# Patient Record
Sex: Female | Born: 1975 | Hispanic: No | Marital: Married | Smoking: Former smoker
Health system: Southern US, Community
[De-identification: ages and names within clinical notes are randomized; demographics above are authoritative.]

## PROBLEM LIST (undated history)

## (undated) DIAGNOSIS — R87619 Unspecified abnormal cytological findings in specimens from cervix uteri: Secondary | ICD-10-CM

## (undated) HISTORY — DX: Unspecified abnormal cytological findings in specimens from cervix uteri: R87.619

---

## 2003-10-28 ENCOUNTER — Other Ambulatory Visit: Admission: RE | Admit: 2003-10-28 | Discharge: 2003-10-28 | Payer: Self-pay | Admitting: Obstetrics and Gynecology

## 2004-11-15 ENCOUNTER — Other Ambulatory Visit: Admission: RE | Admit: 2004-11-15 | Discharge: 2004-11-15 | Payer: Self-pay | Admitting: Obstetrics and Gynecology

## 2005-11-17 ENCOUNTER — Other Ambulatory Visit: Admission: RE | Admit: 2005-11-17 | Discharge: 2005-11-17 | Payer: Self-pay | Admitting: Obstetrics and Gynecology

## 2006-11-21 ENCOUNTER — Other Ambulatory Visit: Admission: RE | Admit: 2006-11-21 | Discharge: 2006-11-21 | Payer: Self-pay | Admitting: Obstetrics and Gynecology

## 2006-12-14 ENCOUNTER — Emergency Department (HOSPITAL_COMMUNITY): Admission: EM | Admit: 2006-12-14 | Discharge: 2006-12-15 | Payer: Self-pay | Admitting: Emergency Medicine

## 2007-11-25 ENCOUNTER — Other Ambulatory Visit: Admission: RE | Admit: 2007-11-25 | Discharge: 2007-11-25 | Payer: Self-pay | Admitting: Obstetrics and Gynecology

## 2008-12-08 ENCOUNTER — Other Ambulatory Visit: Admission: RE | Admit: 2008-12-08 | Discharge: 2008-12-08 | Payer: Self-pay | Admitting: Obstetrics and Gynecology

## 2014-04-24 DIAGNOSIS — R87619 Unspecified abnormal cytological findings in specimens from cervix uteri: Secondary | ICD-10-CM

## 2014-04-24 HISTORY — DX: Unspecified abnormal cytological findings in specimens from cervix uteri: R87.619

## 2015-03-25 HISTORY — PX: COLPOSCOPY: SHX161

## 2017-03-29 LAB — HM PAP SMEAR: HM PAP: NEGATIVE

## 2018-04-29 ENCOUNTER — Ambulatory Visit (INDEPENDENT_AMBULATORY_CARE_PROVIDER_SITE_OTHER): Payer: BC Managed Care – PPO

## 2018-04-29 ENCOUNTER — Ambulatory Visit (INDEPENDENT_AMBULATORY_CARE_PROVIDER_SITE_OTHER): Payer: BC Managed Care – PPO | Admitting: Physician Assistant

## 2018-04-29 ENCOUNTER — Encounter: Payer: Self-pay | Admitting: Physician Assistant

## 2018-04-29 VITALS — BP 131/97 | HR 89 | Ht 66.0 in | Wt 132.0 lb

## 2018-04-29 DIAGNOSIS — Z7689 Persons encountering health services in other specified circumstances: Secondary | ICD-10-CM

## 2018-04-29 DIAGNOSIS — R030 Elevated blood-pressure reading, without diagnosis of hypertension: Secondary | ICD-10-CM

## 2018-04-29 DIAGNOSIS — R200 Anesthesia of skin: Secondary | ICD-10-CM

## 2018-04-29 DIAGNOSIS — M25561 Pain in right knee: Secondary | ICD-10-CM | POA: Diagnosis not present

## 2018-04-29 DIAGNOSIS — Z1231 Encounter for screening mammogram for malignant neoplasm of breast: Secondary | ICD-10-CM | POA: Diagnosis not present

## 2018-04-29 DIAGNOSIS — Z131 Encounter for screening for diabetes mellitus: Secondary | ICD-10-CM

## 2018-04-29 DIAGNOSIS — Z13220 Encounter for screening for lipoid disorders: Secondary | ICD-10-CM

## 2018-04-29 DIAGNOSIS — Z130 Encounter for screening for diseases of the blood and blood-forming organs and certain disorders involving the immune mechanism: Secondary | ICD-10-CM

## 2018-04-29 DIAGNOSIS — S86819A Strain of other muscle(s) and tendon(s) at lower leg level, unspecified leg, initial encounter: Secondary | ICD-10-CM

## 2018-04-29 NOTE — Progress Notes (Signed)
HPI:                                                                Amanda Duffy is a 43 y.o. female [facility] today to establish care  Current concerns:  Reports while shaving her legs on 12/01 she noticed that there was numbness along her right lateral posterior knee. Symptoms are unchanged. Denies tingling or pain.  Then while shaving on 12/20 she noticed a small amount of bruising behind her knee. Reports there was bruising for about 2 weeks, which has resolved Denies known injury or trauma. She walks regularly, but otherwise does not exercise. No knee swelling or pain Denies claudication or calf pain. No history of VTE.   [facility] 04/29/2018  Decreased Interest 0  Down, Depressed, Hopeless 0  PHQ - 2 Score 0  Altered sleeping 0  Tired, decreased energy 1  Change in appetite 0  Feeling bad or failure about yourself  0  Trouble concentrating 0  Moving slowly or fidgety/restless 0  PHQ-9 Score 1    GAD 7 : Generalized Anxiety Score 04/29/2018  Nervous, Anxious, on Edge 0  Control/stop worrying 0  Worry too much - different things 0  Trouble relaxing 0  Restless 0  Easily annoyed or irritable 0  Afraid - awful might happen 0  Total GAD 7 Score 0      Past Medical History:  Diagnosis Date  . Abnormal Pap smear of cervix 2016   Past Surgical History:  Procedure Laterality Date  . COLPOSCOPY  03/2015   Social History   Tobacco Use  . Smoking status: Former Smoker    Packs/day: 0.30    Years: 3.00    Pack years: 0.90    Types: Cigarettes    Last attempt to quit: 05/25/1995    Years since quitting: 22.9  . Smokeless tobacco: Never Used  Substance Use Topics  . Alcohol use: Not Currently   family history includes Heart attack in her maternal grandfather and paternal grandfather; Hypertension in her father.    ROS: negative except as noted in the  HPI  Medications: Current Outpatient Medications  Medication Sig Dispense Refill  . [place]-Eth Estrad Triphasic (ENPRESSE-28) 50-30/75-40/ 125-30 MCG TABS Enpresse 50-30 (6)/75-40(5)/125-30(10) tablet  Take 1 tablet every day by oral route as directed for 90 days.    . Sod Fluoride-Potassium Nitrate (PREVIDENT 5000 SENSITIVE) 1.1-5 % PSTE Prevident 5000 Enamel Protect 1.1 %-5 % dental paste  BRUSH TWICE DAILY     No current facility-administered medications for this visit.    Allergies  Allergen Reactions  . Amoxicillin Rash  . Diphenhydramine Hcl Palpitations    Other reaction(s): Irregular Heart Rate    . Tetracyclines & Related Rash       Objective:  BP (!) 131/97   Pulse 89   Ht 5\' 6"  (1.676 m)   Wt 132 lb (59.9 kg)   LMP 04/10/2018   BMI 21.31 kg/m  Vitals:   04/29/18 1318 04/29/18 1401  BP: 131/87 (!) 131/97  Pulse: 89    Gen:  alert, not ill-appearing, no distress, appropriate for age HEENT: head normocephalic without obvious abnormality, conjunctiva and cornea clear, trachea midline Pulm:  Normal work of breathing, normal phonation Neuro: altered sensation with palpation of the right biceps femoris tendon MSK: extremities atraumatic, normal gait and station, no peripheral edema Right knee: atraumatic, no swelling, no baker's cyst Skin: intact, no rashes on exposed skin, no varicosities Psych: well-groomed, cooperative, good eye contact, euthymic mood, affect mood-congruent, speech is articulate, and thought processes clear and goal-directed    No results found for this or any previous visit (from the past 72 hour(s)). No results found.    Assessment and Plan: 42 y.o. female with   .Diagnoses and all orders for this visit:  Encounter to establish care  Breast cancer screening by mammogram -     MM 3D SCREEN BREAST BILATERAL; Future  Biceps femoris tendon strain at knee -     DG Knee Complete 4 Views Right -     [place] COMPLETE JOINT SPACE  STRUCTURES LOW RIGHT; Future  Numbness of right lower extremity -     DG Knee Complete 4 Views Right -     [place] COMPLETE JOINT SPACE STRUCTURES LOW RIGHT; Future -     C-reactive protein -     Vitamin B12  Elevated blood pressure reading -     COMPLETE METABOLIC PANEL WITH GFR  Screening for lipid disorders -     Lipid Panel w/reflex Direct LDL  Screening for diabetes mellitus -     COMPLETE METABOLIC PANEL WITH GFR  Screening for blood disease -     CBC    - Personally reviewed PMH, PSH, PFH, medications, allergies, HM - Age-appropriate cancer screening: Pap UTD per patient, she actually as an ppt with [facility] today; mammogram ordered - Influenza UTD - Tdap declined - PHQ2 negative - Patient requested routine fasting labs today. Order placed  Numbness of RLE - benign exam - based on history of bruising I suspect there may have been a mild injury to the biceps femoris tendon - provided with home rehab exercises - XR knee and [place] pending   Elevated BP reading - BP in stage 1 hypertensive range on 2 office readings, no prior hx of HTN - labs pending - active surveillance   Patient education and anticipatory guidance given Patient agrees with treatment plan Follow-up in 3 months for BP or sooner as needed if symptoms worsen or fail to improve  Levonne Hubert PA-C

## 2018-04-29 NOTE — Patient Instructions (Signed)
Paresthesia  Paresthesia is an abnormal burning or prickling sensation. It is usually felt in the hands, arms, legs, or feet. However, it may occur in any part of the body. Usually, paresthesia is not painful. It may feel like:  · Tingling or numbness.  · Pins and needles.  · Skin crawling.  · Buzzing.  · Arms or legs falling asleep.  · Itching.  Paresthesia may occur without any clear cause, or it may be caused by:  · Breathing too quickly (hyperventilation).  · Pressure on a nerve.  · An underlying medical condition.  · Side effects of a medication.  · Nutritional deficiencies.  · Exposure to toxic chemicals.  Most people experience temporary (transient) paresthesia at some time in their lives. For some people, it may be long-lasting (chronic) because of an underlying medical condition. If you have paresthesia that lasts a long time, you may need to be evaluated by your health care provider.  Follow these instructions at home:  Alcohol use    · Do not drink alcohol if:  ? Your health care provider tells you not to drink.  ? You are pregnant, may be pregnant, or are planning to become pregnant.  · If you drink alcohol, limit how much you have:  ? 0-1 drink a day for women.  ? 0-2 drinks a day for men.  · Be aware of how much alcohol is in your drink. In the [place] one drink equals one typical bottle of beer (12 oz), one-half glass of wine (5 oz), or one shot of hard liquor (1½ oz).  Nutrition  · Eat a healthy diet. This includes:  ? Eating foods that are high in fiber, such as fresh fruits and vegetables, whole grains, and beans.  ? Limiting foods that are high in fat and processed sugars, such as fried or sweet foods.  General instructions  · Take over-the-counter and prescription medicines only as told by your health care provider.  · Do not use any products that contain nicotine or tobacco, such as cigarettes and e-cigarettes. These can keep blood from reaching damaged nerves. If you need help quitting, ask your  health care provider.  · If you have diabetes, work closely with your health care provider to keep your blood sugar under control.  · If you have numbness in your feet:  ? Check every day for signs of injury or infection. Watch for redness, warmth, and swelling.  ? Wear padded socks and comfortable shoes. These help protect your feet.  · Keep all follow-up visits as told by your health care provider. This is important.  Contact a health care provider if you:  · Have paresthesia that gets worse or does not go away.  · Have a burning or prickling feeling that gets worse when you walk.  · Have pain, cramps, or dizziness.  · Develop a rash.  Get help right away if you:  · Feel weak.  · Have trouble walking or moving.  · Have problems with speech, understanding, or vision.  · Feel confused.  · Cannot control your bladder or bowel movements.  · Have numbness after an injury.  · Develop new weakness in an arm or leg.  · Faint.  Summary  · Paresthesia is an abnormal burning or prickling sensation that is usually felt in the hands, arms, legs, or feet. It may also occur in other parts of the body.  · Paresthesia may occur without any clear cause, or it may be   caused by breathing too quickly (hyperventilation), pressure on a nerve, an underlying medical condition, side effects of a medication, nutritional deficiencies, or exposure to toxic chemicals.  · If you have paresthesia that lasts a long time, you may need to be evaluated by your health care provider.  This information is not intended to replace advice given to you by your health care provider. Make sure you discuss any questions you have with your health care provider.  Document Released: 03/31/2002 Document Revised: 04/19/2017 Document Reviewed: 04/19/2017  Elsevier Interactive Patient Education © 2019 [employer]

## 2018-04-30 LAB — COMPLETE METABOLIC PANEL WITHOUT GFR
AG Ratio: 1.8 (calc) (ref 1.0–2.5)
ALT: 12 U/L (ref 6–29)
AST: 17 U/L (ref 10–30)
Albumin: 4.4 g/dL (ref 3.6–5.1)
Alkaline phosphatase (APISO): 57 U/L (ref 33–115)
BUN: 9 mg/dL (ref 7–25)
CO2: 25 mmol/L (ref 20–32)
Calcium: 9.6 mg/dL (ref 8.6–10.2)
Chloride: 103 mmol/L (ref 98–110)
Creat: 0.87 mg/dL (ref 0.50–1.10)
GFR, Est African American: 97 mL/min/{1.73_m2}
GFR, Est Non African American: 83 mL/min/{1.73_m2}
Globulin: 2.5 g/dL (ref 1.9–3.7)
Glucose, Bld: 76 mg/dL (ref 65–99)
Potassium: 4 mmol/L (ref 3.5–5.3)
Sodium: 138 mmol/L (ref 135–146)
Total Bilirubin: 0.5 mg/dL (ref 0.2–1.2)
Total Protein: 6.9 g/dL (ref 6.1–8.1)

## 2018-04-30 LAB — CBC
HCT: 37.4 % (ref 35.0–45.0)
Hemoglobin: 12.7 g/dL (ref 11.7–15.5)
MCH: 30.4 pg (ref 27.0–33.0)
MCHC: 34 g/dL (ref 32.0–36.0)
MCV: 89.5 fL (ref 80.0–100.0)
MPV: 9 fL (ref 7.5–12.5)
Platelets: 435 10*3/uL — ABNORMAL HIGH (ref 140–400)
RBC: 4.18 Million/uL (ref 3.80–5.10)
RDW: 12.3 % (ref 11.0–15.0)
WBC: 9.1 10*3/uL (ref 3.8–10.8)

## 2018-04-30 LAB — VITAMIN B12: Vitamin B-12: 483 pg/mL (ref 200–1100)

## 2018-04-30 LAB — LIPID PANEL W/REFLEX DIRECT LDL
CHOL/HDL RATIO: 2.7 (calc) (ref <5.0)
Cholesterol: 200 mg/dL — ABNORMAL HIGH (ref <200)
HDL: 73 mg/dL (ref >50)
LDL CHOLESTEROL (CALC): 108 mg/dL — AB
Non-HDL Cholesterol (Calc): 127 mg/dL (calc) (ref <130)
TRIGLYCERIDES: 94 mg/dL (ref <150)

## 2018-04-30 LAB — C-REACTIVE PROTEIN: CRP: 3.1 mg/L (ref <8.0)

## 2018-05-01 NOTE — Progress Notes (Signed)
Labs look great overall. No evidence of anemia, B12 level is normal I would like her to follow-up in the office sooner for her elevated blood pressure readings. I would like her to monitor and log her blood pressures at home at least 3 days/week at around the same time. Follow a heart healthy diet such as Mediterranean or D ASH, limit sodium to no more than 2000 mg/day, try to get at least 20 to 30 minutes of daily aerobic exercise.  This can be as simple as walking. Follow-up in the office in 3 months

## 2018-05-10 ENCOUNTER — Ambulatory Visit
Admission: RE | Admit: 2018-05-10 | Discharge: 2018-05-10 | Disposition: A | Discharge status: Home / Self Care | Payer: BC Managed Care – PPO | Source: Ambulatory Visit | Attending: Physician Assistant | Admitting: Physician Assistant

## 2018-05-10 DIAGNOSIS — R200 Anesthesia of skin: Secondary | ICD-10-CM

## 2018-05-10 DIAGNOSIS — S86819A Strain of other muscle(s) and tendon(s) at lower leg level, unspecified leg, initial encounter: Secondary | ICD-10-CM

## 2018-05-16 ENCOUNTER — Encounter: Payer: Self-pay | Admitting: Physician Assistant

## 2020-05-08 ENCOUNTER — Encounter (HOSPITAL_BASED_OUTPATIENT_CLINIC_OR_DEPARTMENT_OTHER): Payer: Self-pay | Admitting: *Deleted

## 2020-05-08 ENCOUNTER — Other Ambulatory Visit: Payer: Self-pay

## 2020-05-08 ENCOUNTER — Emergency Department (HOSPITAL_BASED_OUTPATIENT_CLINIC_OR_DEPARTMENT_OTHER)
Admission: EM | Admit: 2020-05-08 | Discharge: 2020-05-08 | Disposition: A | Discharge status: Home / Self Care | Payer: BC Managed Care – PPO | Attending: Emergency Medicine | Admitting: Emergency Medicine

## 2020-05-08 DIAGNOSIS — Z87891 Personal history of nicotine dependence: Secondary | ICD-10-CM | POA: Diagnosis not present

## 2020-05-08 DIAGNOSIS — F410 Panic disorder [episodic paroxysmal anxiety] without agoraphobia: Secondary | ICD-10-CM | POA: Diagnosis not present

## 2020-05-08 DIAGNOSIS — T40715A Adverse effect of cannabis, initial encounter: Secondary | ICD-10-CM | POA: Insufficient documentation

## 2020-05-08 DIAGNOSIS — T50905A Adverse effect of unspecified drugs, medicaments and biological substances, initial encounter: Secondary | ICD-10-CM

## 2020-05-08 DIAGNOSIS — F430 Acute stress reaction: Secondary | ICD-10-CM

## 2020-05-08 DIAGNOSIS — R002 Palpitations: Secondary | ICD-10-CM | POA: Diagnosis not present

## 2020-05-08 NOTE — ED Notes (Signed)
pts husband Juie: [phone]

## 2020-05-08 NOTE — ED Triage Notes (Signed)
Pt reports taking 2 CBD gummies at 3:15 today. States that after that she started to feel like she was having a panic attack. Pt reassured in triage, breathing slowed. States that her fingers were numb but are starting to feel normal again.

## 2020-05-08 NOTE — ED Provider Notes (Signed)
[facility] EMERGENCY DEPARTMENT Provider Note   CSN: [id number] Arrival date & time: 05/08/20  1733     History Chief Complaint  Patient presents with  . Panic Attack    Amanda Duffy is a 45 y.o. female.  HPI Patient reports that she had some stressors in her day.  She reports they are minor stressors but thought that she would like to try some over-the-counter CBD Gummies from the vape shop to help her relax.  Reports she took 1 at a little after 2 and did not notice any change so she took another 1 about an hour later.  Within an hour she reports her heart started racing really fast.  She reports that she felt like it was a panic attack and then because of the racing heart started to breathe fast as well.  Then she started to get tingling around her mouth and feeling she could not form her words although she knew what she wanted to say. She reports at that point she decided to come to the emergency department.  She reports the symptoms lasted about 2 hours.  When she got to the emergency department they started to resolve.  She reports at this point she feels back to normal.  She reports he has had a panic attack at one point before and it felt very similar.  No other drug use or alcohol use.  Patient ports she is felt well otherwise.  She has not been sick recently.  She reports she has some minor stresses with work and home life but no suicidal ideation.    Past Medical History:  Diagnosis Date  . Abnormal Pap smear of cervix 2016    Patient Active Problem List   Diagnosis Date Noted  . Biceps femoris tendon strain at knee 04/29/2018  . Numbness of right lower extremity 04/29/2018  . Elevated blood pressure reading 04/29/2018    Past Surgical History:  Procedure Laterality Date  . COLPOSCOPY  03/2015     OB History    Gravida  0   Para  0   Term  0   Preterm  0   AB  0   Living  0     SAB  0   IAB  0   Ectopic  0   Multiple  0   Live  Births  0           Family History  Problem Relation Age of Onset  . Hypertension Father   . Heart attack Maternal Grandfather   . Heart attack Paternal Grandfather     Social History   Tobacco Use  . Smoking status: Former Smoker    Packs/day: 0.30    Years: 3.00    Pack years: 0.90    Types: Cigarettes    Quit date: 05/25/1995    Years since quitting: 24.9  . Smokeless tobacco: Never Used  Vaping Use  . Vaping Use: Former  Substance Use Topics  . Alcohol use: Not Currently  . Drug use: Never    Home Medications Prior to Admission medications   Medication Sig Start Date End Date Taking? Authorizing Provider  Alain Honey Triphasic (ENPRESSE-28) 50-30/75-40/ 125-30 MCG TABS Enpresse 50-30 (6)/75-40(5)/125-30(10) tablet  Take 1 tablet every day by oral route as directed for 90 days.    [provider]  Sod Fluoride-Potassium Nitrate (PREVIDENT 5000 SENSITIVE) 1.1-5 % PSTE Prevident 5000 Enamel Protect 1.1 %-5 % dental paste  BRUSH TWICE DAILY  [provider]    Allergies    Amoxicillin, Diphenhydramine hcl, and Tetracyclines & related  Review of Systems   Review of Systems 10 systems reviewed and negative except as per HPI Physical Exam Updated Vital Signs BP 140/89   Pulse 96   Temp 97.6 F (36.4 C) (Oral)   Resp 16   Ht 5\' 6"  (1.676 m)   Wt 63.5 kg   LMP 05/05/2020   SpO2 98%   BMI 22.60 kg/m   Physical Exam Constitutional:      Appearance: She is well-developed and well-nourished.  HENT:     Head: Normocephalic and atraumatic.  Eyes:     Extraocular Movements: Extraocular movements intact and EOM normal.     Pupils: Pupils are equal, round, and reactive to light.  Cardiovascular:     Rate and Rhythm: Normal rate and regular rhythm.     Pulses: Intact distal pulses.     Heart sounds: Normal heart sounds.  Pulmonary:     Effort: Pulmonary effort is normal.     Breath sounds: Normal breath sounds.  Abdominal:      General: Bowel sounds are normal. There is no distension.     Palpations: Abdomen is soft.     Tenderness: There is no abdominal tenderness.  Musculoskeletal:        General: No edema. Normal range of motion.     Cervical back: Neck supple.  Skin:    General: Skin is warm, dry and intact.  Neurological:     General: No focal deficit present.     Mental Status: She is alert and oriented to person, place, and time.     GCS: GCS eye subscore is 4. GCS verbal subscore is 5. GCS motor subscore is 6.     Coordination: Coordination normal.     Deep Tendon Reflexes: Strength normal.  Psychiatric:        Mood and Affect: Mood and affect and mood normal.     ED Results / Procedures / Treatments   Labs (all labs ordered are listed, but only abnormal results are displayed) Labs Reviewed - No data to display  EKG None  Radiology No results found.  Procedures Procedures (including critical care time)  Medications Ordered in ED Medications - No data to display  ED Course  I have reviewed the triage vital signs and the nursing notes.  Pertinent labs & imaging results that were available during my care of the patient were reviewed by me and considered in my medical decision making (see chart for details).    MDM Rules/Calculators/A&P                          Patient has symptoms as outlined.  At this time is appears most consistent with adverse drug reaction.  She was well and after taking 2 CBD Gummies developed rapid heart rate palpitations.  She reports the symptoms escalated with hyperventilating and feeling tingling around her mouth and a difficult time forming her words.  All symptoms resolved shortly after arriving to the emergency department.  return precautions reviewed. Final Clinical Impression(s) / ED Diagnoses Final diagnoses:  Palpitation  Panic attack as reaction to stress  Adverse drug effect, initial encounter    Rx / DC Orders ED Discharge Orders    None        07/03/2020, MD 05/08/20 2141

## 2020-05-08 NOTE — Discharge Instructions (Signed)
1.  Your symptoms appear to be due to an adverse effect to over-the-counter CBD product. 2.  Return to the emergency department immediately if you develop chest pain, recurrence of heart racing, shortness of breath or any other concerning symptoms.

## 2020-05-31 IMAGING — US US EXTREM LOW*R* COMPLETE
1 series · 14 of 20 positions shown · non-contrast
Comparison: Radiographs dated 04/29/2018

CLINICAL DATA: Numbness at the posterolateral aspect of the right
knee. Recent bruising. No known trauma.

EXAM:
RIGHT LOWER EXTREMITY SOFT TISSUE ULTRASOUND COMPLETE
TECHNIQUE: Ultrasound examination was performed including evaluation of the
muscles, tendons, joint, and adjacent soft tissues.

[Series 1: us extrem low*right* complete · 20 acquisitions, 14 frames shown]
[im 1/20]
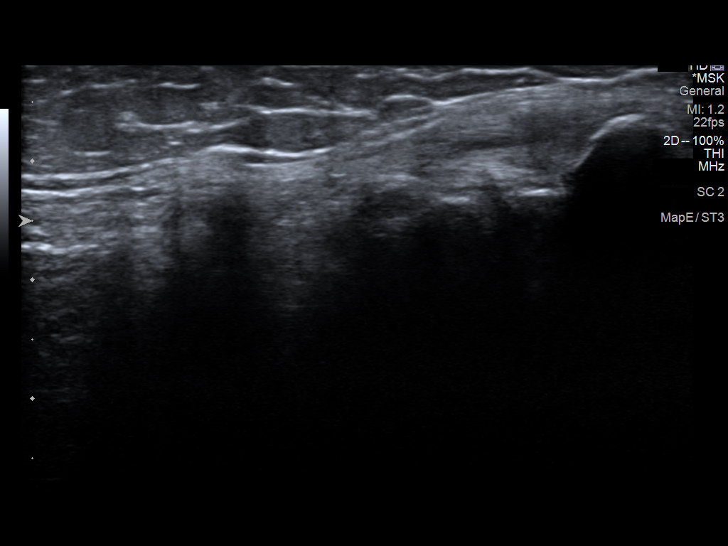
[im 3/20]
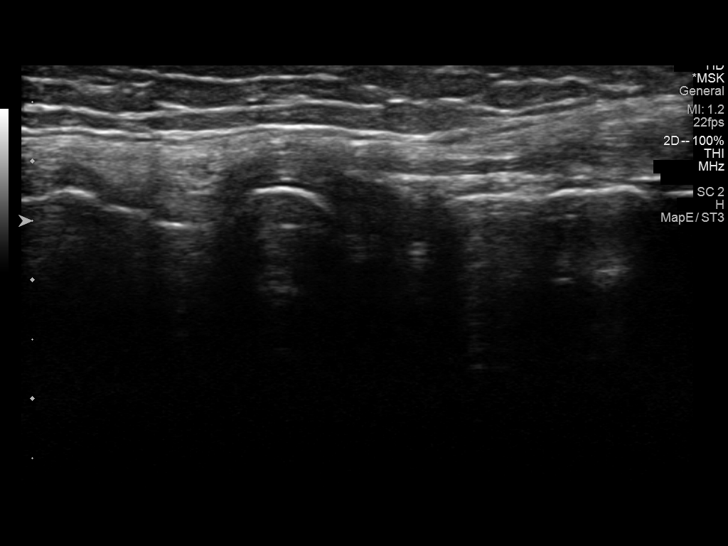
[im 4/20]
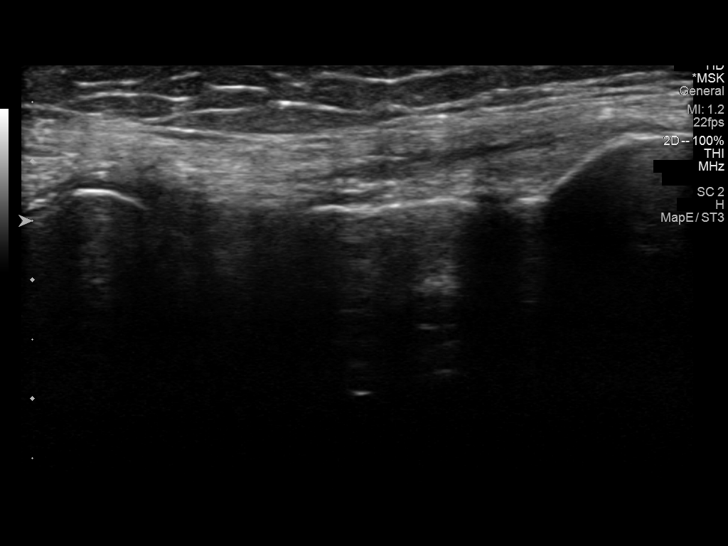
[im 6/20]
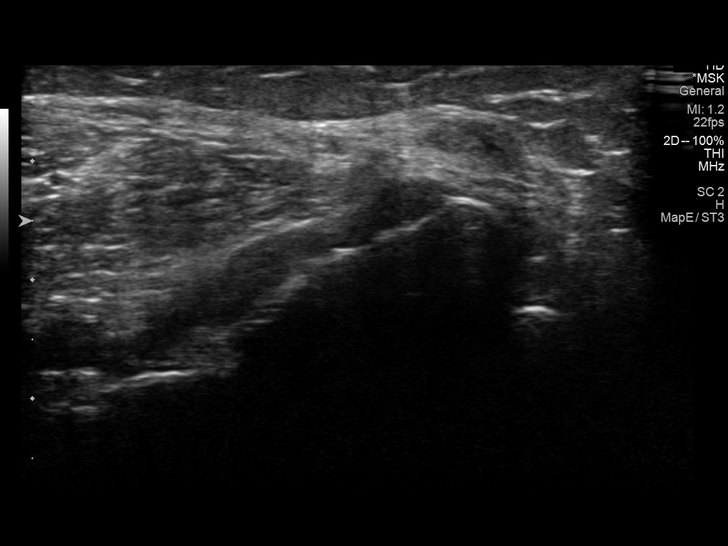
[im 7/20]
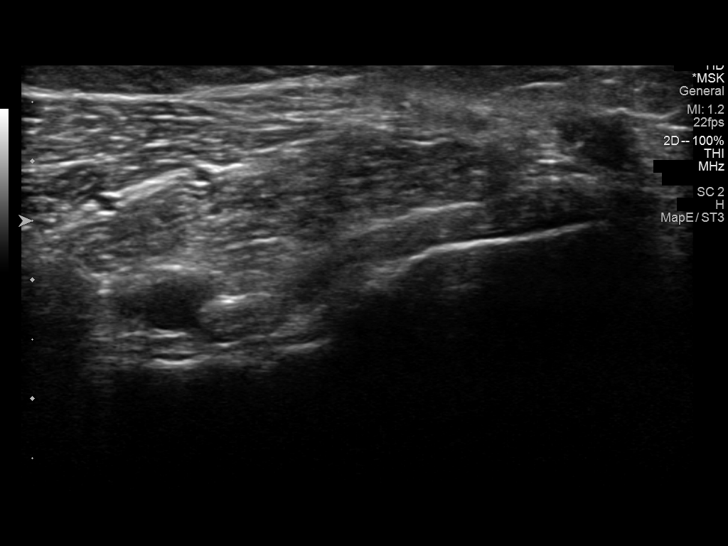
[im 8/20]
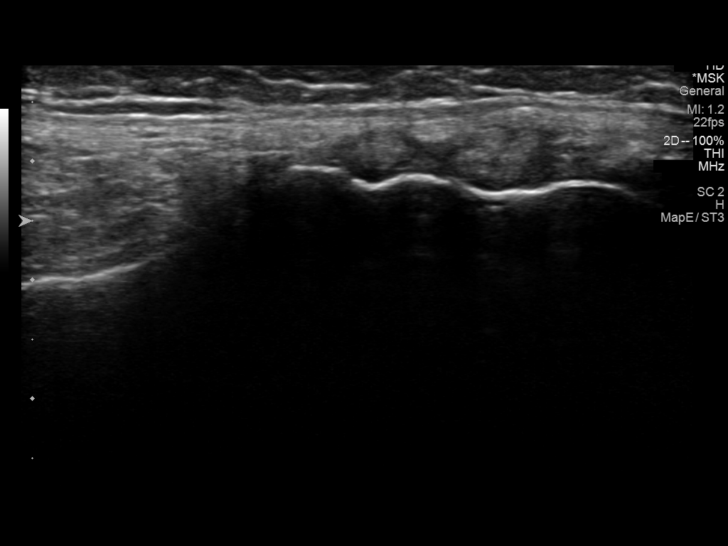
[im 10/20]
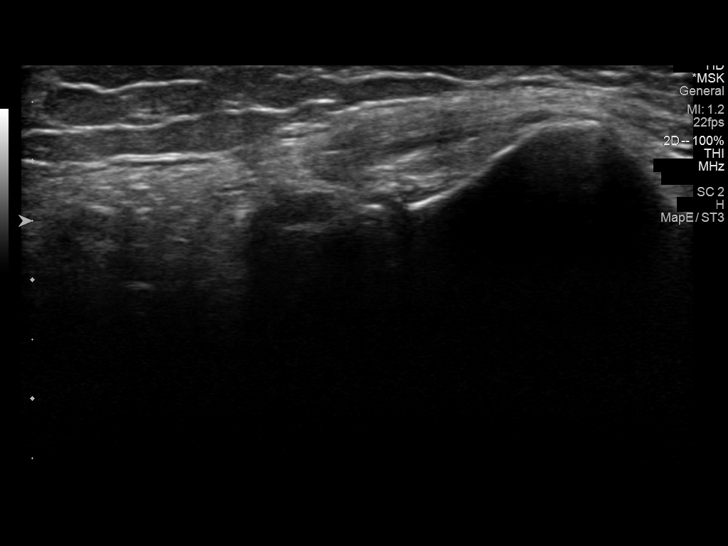
[im 11/20]
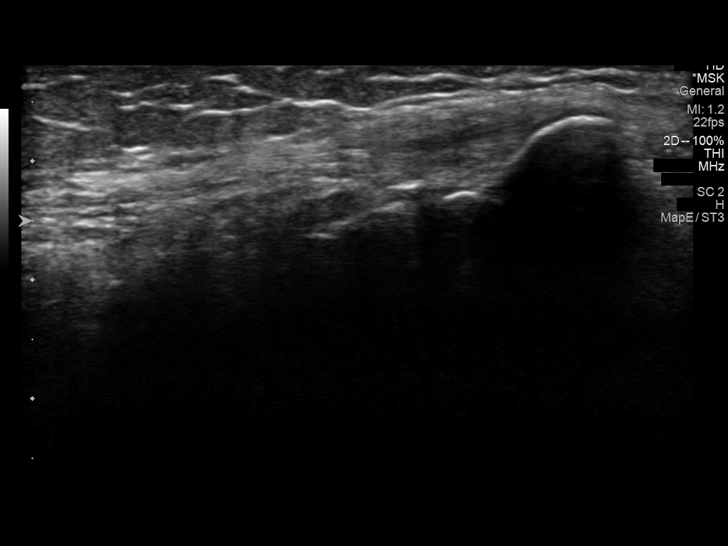
[im 13/20]
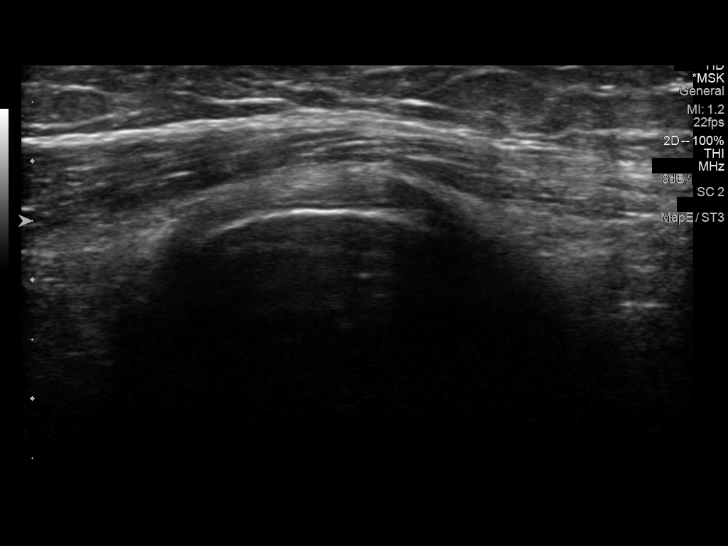
[im 14/20]
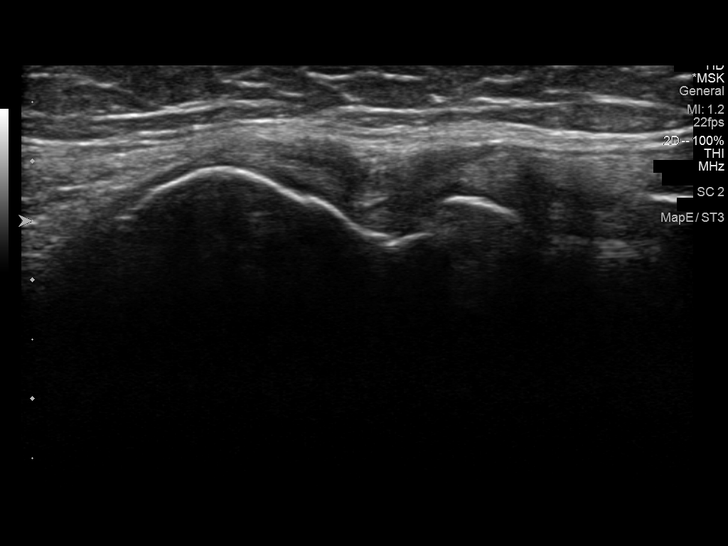
[im 16/20]
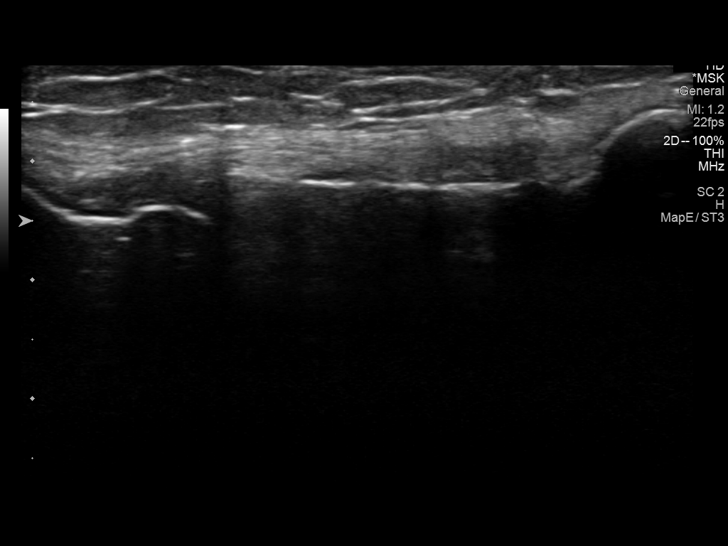
[im 17/20]
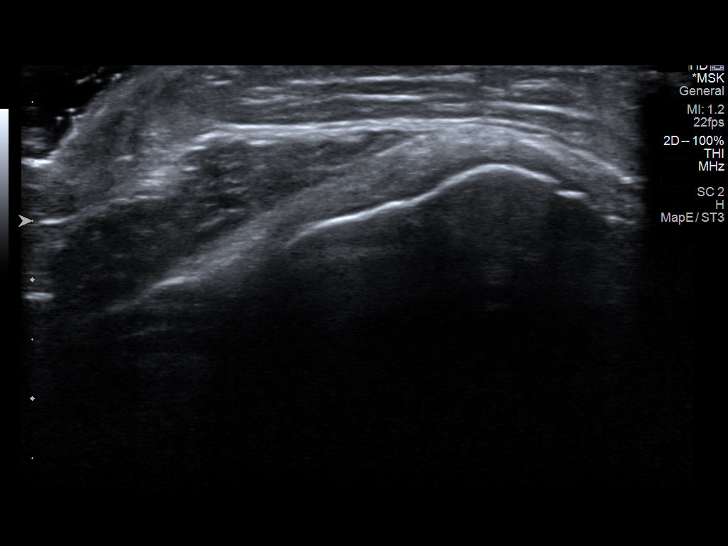
[im 18/20]
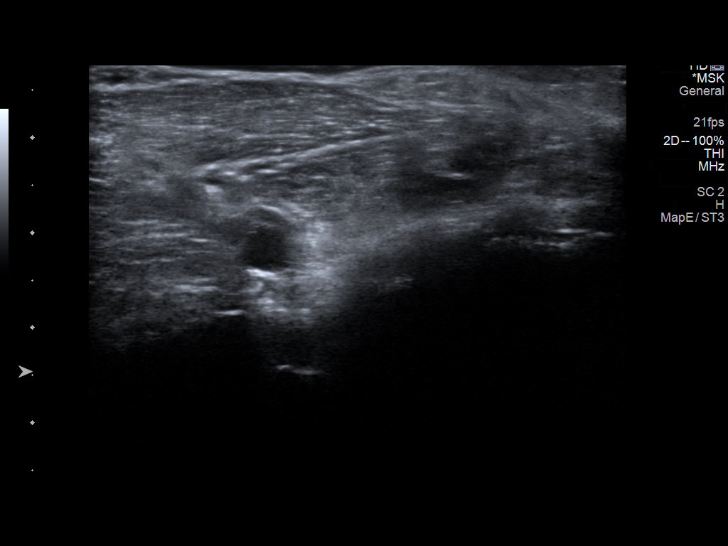
[im 20/20]
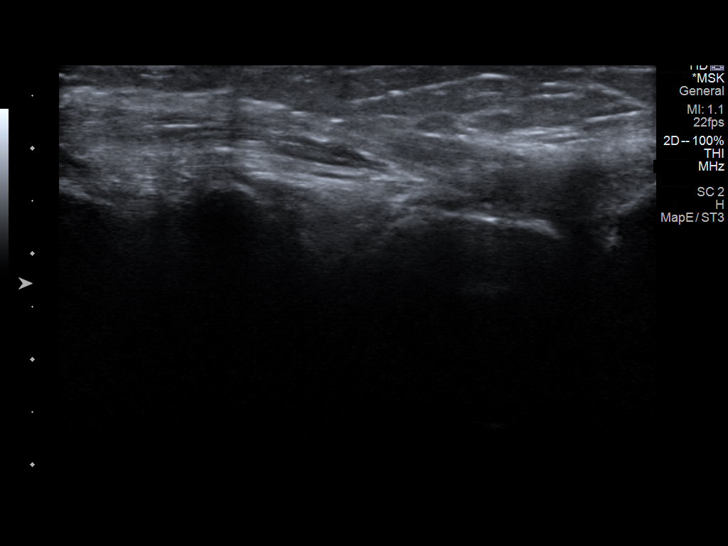

[14 of 20 positions shown; findings below may reference images not displayed]

FINDINGS: Joint Space: No right knee effusion or Baker's cyst.

Muscles: The muscles at the posterior and posterolateral aspects of
right knee appear normal. No masses. No evidence of muscle tear or
hematoma.

Tendons: The tendons at the posterolateral aspect of the right knee
appear normal. Specifically, the lateral collateral ligament and the
biceps femoris tendon appear normal. The popliteus tendon and muscle
appear normal.

Other Soft Tissue Structures: I examined the posterolateral corner
in extended position and 90 degrees of flexion. The popliteofibular
ligament is demonstrated on images 11 and 12 of series 1 and is
intact.
IMPRESSION: Negative ultrasound of right knee. See no explanation for the
patient's focal decreased sensation to touch at the posterolateral
aspect of the knee.

## 2021-07-16 ENCOUNTER — Other Ambulatory Visit: Payer: Self-pay

## 2021-07-16 ENCOUNTER — Emergency Department
Admission: RE | Admit: 2021-07-16 | Discharge: 2021-07-16 | Disposition: A | Discharge status: Home / Self Care | Payer: BC Managed Care – PPO | Source: Ambulatory Visit

## 2021-07-16 VITALS — BP 127/88 | HR 103 | Temp 98.3°F | Resp 18 | Ht 66.0 in | Wt 140.0 lb

## 2021-07-16 DIAGNOSIS — K122 Cellulitis and abscess of mouth: Secondary | ICD-10-CM | POA: Diagnosis not present

## 2021-07-16 DIAGNOSIS — J029 Acute pharyngitis, unspecified: Secondary | ICD-10-CM

## 2021-07-16 LAB — POCT RAPID STREP A (OFFICE): Rapid Strep A Screen: NEGATIVE

## 2021-07-16 LAB — POC SARS CORONAVIRUS 2 AG -  ED: SARS Coronavirus 2 Ag: NEGATIVE

## 2021-07-16 MED ORDER — AZITHROMYCIN 250 MG PO TABS: 250.0000 mg | ORAL_TABLET | Freq: Every day | ORAL | 0 refills | Status: DC

## 2021-07-16 MED ORDER — PREDNISONE 20 MG PO TABS: ORAL_TABLET | ORAL | 0 refills | Status: DC

## 2021-07-16 NOTE — ED Triage Notes (Signed)
Pt states that she has a sore throat. X2 days ? ?Pt states that she is vaccinated for covid.  ?Pt states that she has had flu vaccine.  ?

## 2021-07-16 NOTE — Discharge Instructions (Addendum)
Advised patient to take medication as directed with food to completion.  Advised patient to take prednisone with Zithromax for the next 5 days.  Encouraged patient to increase daily water intake while taking these medications.  Advised patient if symptom worsens and/or unresolved please follow-up with PCP or here for further evaluation. ?

## 2021-07-16 NOTE — ED Provider Notes (Signed)
?[place] ? ? ? ?CSN: [id number] ?Arrival date & time: 07/16/21  1048 ? ? ?  ? ?History   ?Chief Complaint ?Chief Complaint  ?Patient presents with  ? Sore Throat  ?  Sore throat x2 days  ? ? ?HPI ?Amanda Duffy is a 46 y.o. female.  ? ?HPI 46 year old female presents with sore throat for 2 days.  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 ? ?Past Medical History:  ?Diagnosis Date  ? Abnormal Pap smear of cervix 2016  ? ? ?Patient Active Problem List  ? Diagnosis Date Noted  ? Biceps femoris tendon strain at knee 04/29/2018  ? Numbness of right lower extremity 04/29/2018  ? Elevated blood pressure reading 04/29/2018  ? ? ?Past Surgical History:  ?Procedure Laterality Date  ? COLPOSCOPY  03/2015  ? ? ?OB History   ? ? Gravida  ?0  ? Para  ?0  ? Term  ?0  ? Preterm  ?0  ? AB  ?0  ? Living  ?0  ?  ? ? SAB  ?0  ? IAB  ?0  ? Ectopic  ?0  ? Multiple  ?0  ? Live Births  ?0  ?   ?  ?  ? ? ? ?Home Medications   ? ?Prior to Admission medications   ?Medication Sig Start Date End Date Taking? Authorizing Provider  ?azithromycin (ZITHROMAX) 250 MG tablet Take 1 tablet (250 mg total) by mouth daily. Take first 2 tablets together, then 1 every day until finished. 07/16/21  Yes Eliezer Lofts, FNP  ?predniSONE (DELTASONE) 20 MG tablet Take 3 tabs PO daily x 5 days. 07/16/21  Yes Eliezer Lofts, FNP  ?[place] Triphasic (ENPRESSE-28) 50-30/75-40/ 125-30 MCG TABS Enpresse 50-30 (6)/75-40(5)/125-30(10) tablet ? Take 1 tablet every day by oral route as directed for 90 days.    [provider]  ?Sod Fluoride-Potassium Nitrate (PREVIDENT 5000 SENSITIVE) 1.1-5 % PSTE Prevident 5000 Enamel Protect 1.1 %-5 % dental paste ? BRUSH TWICE DAILY    [provider]  ? ? ?Family History ?Family History  ?Problem Relation Age of Onset  ? Hypertension Father   ? Heart attack Maternal Grandfather   ? Heart attack Paternal Grandfather   ? ? ?Social History ?Social  History  ? ?Tobacco Use  ? Smoking status: Former  ?  Packs/day: 0.30  ?  Years: 3.00  ?  Pack years: 0.90  ?  Types: Cigarettes  ?  Quit date: 05/25/1995  ?  Years since quitting: 26.1  ? Smokeless tobacco: Never  ?Vaping Use  ? Vaping Use: Former  ?Substance Use Topics  ? Alcohol use: Yes  ?  Comment: occ  ? Drug use: Never  ? ? ? ?Allergies   ?Amoxicillin, Diphenhydramine hcl, and Tetracyclines & related ? ? ?Review of Systems ?Review of Systems  ?HENT:  Positive for sore throat.   ?All other systems reviewed and are negative. ? ? ?Physical Exam ?Triage Vital Signs ?ED Triage Vitals  ?Enc Vitals Group  ?   BP 07/16/21 1104 127/88  ?   Pulse Rate 07/16/21 1104 (!) 103  ?   Resp 07/16/21 1104 18  ?   Temp 07/16/21 1104 98.3 ?F (36.8 ?C)  ?   Temp Source 07/16/21 1104 Oral  ?   SpO2 07/16/21 1104 97 %  ?   Weight 07/16/21 1102 140 lb (63.5 kg)  ?   Height 07/16/21 1102 5\' 6"  (  1.676 m)  ?   Head Circumference --   ?   Peak Flow --   ?   Pain Score 07/16/21 1102 7  ?   Pain Loc --   ?   Pain Edu? --   ?   Excl. in [place]? --   ? ?No data found. ? ?Updated Vital Signs ?BP 127/88 (BP Location: Right Arm)   Pulse (!) 103   Temp 98.3 ?F (36.8 ?C) (Oral)   Resp 18   Ht 5\' 6"  (1.676 m)   Wt 140 lb (63.5 kg)   LMP 07/06/2021   SpO2 97%   BMI 22.60 kg/m?  ? ? ?Physical Exam ?Vitals and nursing note reviewed.  ?Constitutional:   ?   General: She is not in acute distress. ?   Appearance: Normal appearance. She is well-developed and normal weight. She is not ill-appearing.  ?HENT:  ?   Head: Normocephalic and atraumatic.  ?   Right Ear: Tympanic membrane, ear canal and external ear normal.  ?   Left Ear: Tympanic membrane, ear canal and external ear normal.  ?   Mouth/Throat:  ?   Mouth: Mucous membranes are moist.  ?   Pharynx: Oropharynx is clear. Uvula midline. Posterior oropharyngeal erythema and uvula swelling present.  ?   Tonsils: 2+ on the right. 2+ on the left.  ?Eyes:  ?   Conjunctiva/sclera: Conjunctivae normal.  ?    Pupils: Pupils are equal, round, and reactive to light.  ?Cardiovascular:  ?   Rate and Rhythm: Normal rate and regular rhythm.  ?   Pulses: Normal pulses.  ?   Heart sounds: Normal heart sounds. No murmur heard. ?Pulmonary:  ?   Effort: Pulmonary effort is normal.  ?   Breath sounds: Normal breath sounds. No wheezing, rhonchi or rales.  ?Musculoskeletal:  ?   Cervical back: Normal range of motion and neck supple.  ?Skin: ?   General: Skin is warm and dry.  ?Neurological:  ?   General: No focal deficit present.  ?   Mental Status: She is alert and oriented to person, place, and time.  ? ? ? ?UC Treatments / Results  ?Labs ?(all labs ordered are listed, but only abnormal results are displayed) ?Labs Reviewed  ?POC SARS CORONAVIRUS 2 AG -  ED - Normal  ?POCT RAPID STREP A (OFFICE) - Normal  ? ? ?EKG ? ? ?Radiology ?No results found. ? ?Procedures ?Procedures (including critical care time) ? ?Medications Ordered in UC ?Medications - No data to display ? ?Initial Impression / Assessment and Plan / UC Course  ?I have reviewed the triage vital signs and the nursing notes. ? ?Pertinent labs & imaging results that were available during my care of the patient were reviewed by me and considered in my medical decision making (see chart for details). ? ?  ? ?MDM: 1.  Acute pharyngitis, unspecified etiology-Rx'd Zithromax: 2.  Uvulitis-Rx'd prednisone. Advised patient to take medication as directed with food to completion.  Advised patient to take prednisone with Zithromax for the next 5 days.  Encouraged patient to increase daily water intake while taking these medications.  Advised patient if symptom worsens and/or unresolved please follow-up with PCP or here for further evaluation.  Patient discharged home, hemodynamically stable.  ? ? ?Final Clinical Impressions(s) / UC Diagnoses  ? ?Final diagnoses:  ?Acute pharyngitis, unspecified etiology  ?Uvulitis  ? ? ? ?Discharge Instructions   ? ?  ?Advised patient to take  medication  as directed with food to completion.  Advised patient to take prednisone with Zithromax for the next 5 days.  Encouraged patient to increase daily water intake while taking these medications.  Advised patient if symptom worsens and/or unresolved please follow-up with PCP or here for further evaluation. ? ? ? ? ?ED Prescriptions   ? ? Medication Sig Dispense Auth. Provider  ? azithromycin (ZITHROMAX) 250 MG tablet Take 1 tablet (250 mg total) by mouth daily. Take first 2 tablets together, then 1 every day until finished. 6 tablet Eliezer Lofts, FNP  ? predniSONE (DELTASONE) 20 MG tablet Take 3 tabs PO daily x 5 days. 15 tablet Eliezer Lofts, FNP  ? ?  ? ?PDMP not reviewed this encounter. ?  ?Eliezer Lofts, [place] ?07/16/21 1203 ? ?

## 2022-06-10 ENCOUNTER — Other Ambulatory Visit: Payer: Self-pay

## 2022-06-10 ENCOUNTER — Ambulatory Visit
Admission: RE | Admit: 2022-06-10 | Discharge: 2022-06-10 | Disposition: A | Discharge status: Home / Self Care | Payer: BC Managed Care – PPO | Source: Ambulatory Visit | Attending: Family Medicine | Admitting: Family Medicine

## 2022-06-10 VITALS — BP 121/83 | HR 85 | Temp 98.3°F | Resp 17

## 2022-06-10 DIAGNOSIS — M25441 Effusion, right hand: Secondary | ICD-10-CM

## 2022-06-10 NOTE — ED Provider Notes (Signed)
[facility]    CSN: [id number] Arrival date & time: 06/10/22  1059      History   Chief Complaint Chief Complaint  Patient presents with   Finger Injury    Entered by patient    HPI Amanda Duffy is a 47 y.o. female.   HPI  Patient has noticed some swelling around the right ring finger PIP joint.  Some limited movement.  Very little pain.  Also has a couple of bumps on her skin surrounding this joint and on the left ring finger as well.  Patient has not no idea what might have caused this.  She has not had any recent illness.  Overuse.  Injury.  Gardening or outdoor activity.  Never had similar symptoms.  Past Medical History:  Diagnosis Date   Abnormal Pap smear of cervix 2016    Patient Active Problem List   Diagnosis Date Noted   Biceps femoris tendon strain at knee 04/29/2018   Numbness of right lower extremity 04/29/2018   Elevated blood pressure reading 04/29/2018    Past Surgical History:  Procedure Laterality Date   COLPOSCOPY  03/2015    OB History     Gravida  0   Para  0   Term  0   Preterm  0   AB  0   Living  0      SAB  0   IAB  0   Ectopic  0   Multiple  0   Live Births  0            Home Medications    Prior to Admission medications   Medication Sig Start Date End Date Taking? Authorizing Provider  [place] Triphasic (ENPRESSE-28) 50-30/75-40/ 125-30 MCG TABS Enpresse 50-30 (6)/75-40(5)/125-30(10) tablet  Take 1 tablet every day by oral route as directed for 90 days.    [provider]  Sod Fluoride-Potassium Nitrate (PREVIDENT 5000 SENSITIVE) 1.1-5 % PSTE Prevident 5000 Enamel Protect 1.1 %-5 % dental paste  BRUSH TWICE DAILY    [provider]    Family History Family History  Problem Relation Age of Onset   Obesity Mother    Autoimmune disease Mother    Hypertension Father    Heart attack Maternal Grandfather    Heart attack Paternal Grandfather     Social  History Social History   Tobacco Use   Smoking status: Former    Packs/day: 0.30    Years: 3.00    Total pack years: 0.90    Types: Cigarettes    Quit date: 05/25/1995    Years since quitting: 27.0   Smokeless tobacco: Never  Vaping Use   Vaping Use: Former  Substance Use Topics   Alcohol use: Yes    Comment: occ   Drug use: Never     Allergies   Amoxicillin, Diphenhydramine hcl, and Tetracyclines & related   Review of Systems Review of Systems  See HPI Physical Exam   No data found.  Updated Vital Signs BP 121/83 (BP Location: Left Arm)   Pulse 85   Temp 98.3 F (36.8 C) (Oral)   Resp 17   SpO2 99%      Physical Exam Constitutional:      General: She is not in acute distress.    Appearance: Normal appearance. She is well-developed and normal weight. She is not ill-appearing.  HENT:     Head: Normocephalic and atraumatic.  Eyes:     Conjunctiva/sclera: Conjunctivae normal.  Pupils: Pupils are equal, round, and reactive to light.  Cardiovascular:     Rate and Rhythm: Normal rate.  Pulmonary:     Effort: Pulmonary effort is normal. No respiratory distress.  Abdominal:     General: There is no distension.     Palpations: Abdomen is soft.  Musculoskeletal:        General: Normal range of motion.     Cervical back: Normal range of motion.     Comments: Both hands are examined.  There is slight soft tissue swelling around the PIP joint on the right hand.  Limits full extension and flexion.  No tenderness.  No resistance to flexion or extension.  No instability to exam.  On both ring fingers there are 2 mm papules that are faintly erythematous.  Skin:    General: Skin is warm and dry.  Neurological:     Mental Status: She is alert.      UC Treatments / Results  Labs (all labs ordered are listed, but only abnormal results are displayed) Labs Reviewed - No data to display  EKG   Radiology No results found.  Procedures Procedures (including  critical care time)  Medications Ordered in UC Medications - No data to display  Initial Impression / Assessment and Plan / UC Course  I have reviewed the triage vital signs and the nursing notes.  Pertinent labs & imaging results that were available during my care of the patient were reviewed by me and considered in my medical decision making (see chart for details).     I am unable to provide a diagnosis for this patient's swelling.  No trauma.  No illness.  No overuse.  No injury.  No systemic symptoms.  Final Clinical Impressions(s) / UC Diagnoses   Final diagnoses:  Finger joint swelling, right     Discharge Instructions      May use ice and elevation to reduce swelling If uncomfortable, take ibuprofen or naproxen over-the-counter If discomfort persists, see sports medicine doctor     ED Prescriptions   None    PDMP not reviewed this encounter.   Raylene Everts, MD 06/10/22 1136

## 2022-06-10 NOTE — ED Triage Notes (Signed)
Pt is here with right ring finger swelling and irritation for 6 days, pt has taken Tylenol to relieve discomfort.

## 2022-06-10 NOTE — Discharge Instructions (Addendum)
May use ice and elevation to reduce swelling If uncomfortable, take ibuprofen or naproxen over-the-counter If discomfort persists, see sports medicine doctor
